# Patient Record
Sex: Male | Born: 1990 | Race: White | Hispanic: No | Marital: Married | Smoking: Never smoker
Health system: Southern US, Community
[De-identification: ages and names within clinical notes are randomized; demographics above are authoritative.]

---

## 2015-05-20 ENCOUNTER — Emergency Department (HOSPITAL_COMMUNITY)
Admission: EM | Admit: 2015-05-20 | Discharge: 2015-05-20 | Disposition: A | Discharge status: Home / Self Care | Attending: Emergency Medicine | Admitting: Emergency Medicine

## 2015-05-20 ENCOUNTER — Encounter (HOSPITAL_COMMUNITY): Payer: Self-pay | Admitting: Emergency Medicine

## 2015-05-20 ENCOUNTER — Emergency Department (HOSPITAL_COMMUNITY)

## 2015-05-20 DIAGNOSIS — R222 Localized swelling, mass and lump, trunk: Secondary | ICD-10-CM | POA: Diagnosis not present

## 2015-05-20 DIAGNOSIS — Y929 Unspecified place or not applicable: Secondary | ICD-10-CM | POA: Diagnosis not present

## 2015-05-20 DIAGNOSIS — Y9367 Activity, basketball: Secondary | ICD-10-CM | POA: Insufficient documentation

## 2015-05-20 DIAGNOSIS — S01511A Laceration without foreign body of lip, initial encounter: Secondary | ICD-10-CM | POA: Insufficient documentation

## 2015-05-20 DIAGNOSIS — R0781 Pleurodynia: Secondary | ICD-10-CM | POA: Diagnosis not present

## 2015-05-20 DIAGNOSIS — W19XXXA Unspecified fall, initial encounter: Secondary | ICD-10-CM | POA: Diagnosis not present

## 2015-05-20 DIAGNOSIS — Y999 Unspecified external cause status: Secondary | ICD-10-CM | POA: Insufficient documentation

## 2015-05-20 DIAGNOSIS — T07XXXA Unspecified multiple injuries, initial encounter: Secondary | ICD-10-CM

## 2015-05-20 DIAGNOSIS — S0101XA Laceration without foreign body of scalp, initial encounter: Secondary | ICD-10-CM | POA: Insufficient documentation

## 2015-05-20 DIAGNOSIS — S0990XA Unspecified injury of head, initial encounter: Secondary | ICD-10-CM | POA: Diagnosis present

## 2015-05-20 MED ORDER — LIDOCAINE-EPINEPHRINE-TETRACAINE (LET) SOLUTION
3.0000 mL | Freq: Once | NASAL | Status: AC
  Administered 2015-05-20: 3 mL via TOPICAL
  Filled 2015-05-20: qty 3

## 2015-05-20 MED ORDER — HYDROCODONE-ACETAMINOPHEN 5-325 MG PO TABS
1.0000 | ORAL_TABLET | Freq: Once | ORAL | Status: AC
  Administered 2015-05-20: 1 via ORAL
  Filled 2015-05-20: qty 1

## 2015-05-20 NOTE — ED Notes (Signed)
Patient is inmate [facility] stating he fell on the basketball court injuring his head. Patient has laceration noted to right side of head. Bleeding controlled at triage. Also has swelling and laceration on lower lip and abrasion to right side of face. Patient also complaining of left rib pain. States "I hurt my ribs a while back but they are worse tonight." Patient denies LOC. Denies assault.

## 2015-05-20 NOTE — ED Provider Notes (Signed)
CSN: [id number]     Arrival date & time 05/20/15  2127 History   First MD Initiated Contact with Patient 05/20/15 2145     Chief Complaint  Patient presents with  . Head Injury     (Consider location/radiation/quality/duration/timing/severity/associated sxs/prior Treatment) Patient is a 25 y.o. male presenting with head injury. The history is provided by the patient.  Head Injury Location:  L parietal Time since incident:  3 hours Mechanism of injury comment:  Basketball Pain details:    Quality:  Sharp   Radiates to:  Face   Severity:  Moderate   Timing:  Constant   Progression:  Worsening Chronicity:  New Relieved by:  Nothing Worsened by:  Nothing tried Ineffective treatments:  None tried Associated symptoms: headache   Associated symptoms: no blurred vision, no double vision and no loss of consciousness    Eugene Eugene [name] is a 25 y.o. male who [facility] the ED [facility] with a laceration to his head. Patient states he was playing basketball and fell. He also complains of left rib pain and swelling and laceratoin of the lower lip. Patient denies assault  History reviewed. No pertinent past medical history. History reviewed. No pertinent past surgical history. History reviewed. No pertinent family history. Social History  Substance Use Topics  . Smoking status: Never Smoker   . Smokeless tobacco: None  . Alcohol Use: No    Review of Systems  HENT: Positive for facial swelling.   Eyes: Negative for blurred vision and double vision.  Musculoskeletal: Positive for arthralgias.       Left rib pain  Skin: Positive for wound.  Neurological: Positive for headaches. Negative for loss of consciousness.  all other systems negative    Allergies  Review of patient's allergies indicates no known allergies.  Home Medications   Prior to Admission medications   Not on File   BP 135/69 mmHg  Pulse 84  Temp(Src) 98.2 F (36.8 C) (Oral)  Resp 14  Ht  5\' 7"  (1.702 Eugene [name])  Wt 81.647 kg  BMI 28.19 kg/m2  SpO2 99% Physical Exam  Constitutional: He is oriented to person, place, and time. He appears well-developed and well-nourished.  HENT:  Right Ear: Tympanic membrane normal.  Left Ear: Tympanic membrane normal.  Nose: Nose normal.  Mouth/Throat: Uvula is midline.    There is swelling to the left side of the lower lip and there is a laceration to the inside of the lower lip. Bleeding has stopped.   There is a 1 cm laceration to the left side of the scalp.   Eyes: EOM are normal. Pupils are equal, round, and reactive to light.  Neck: Normal range of motion. Neck supple.  Cardiovascular: Normal rate and regular rhythm.   Pulmonary/Chest: Effort normal and breath sounds normal.  Abdominal: Soft. Bowel sounds are normal. There is no tenderness.  Musculoskeletal: Normal range of motion.  Neurological: He is alert and oriented to person, place, and time. No cranial nerve deficit.  Skin: Skin is warm and dry.  Psychiatric: He has a normal mood and affect. His behavior is normal.  Nursing note and vitals reviewed.   ED Course  .Marland Kitchen.Laceration Repair Date/Time: 05/20/2015 11:32 PM Performed by: Janne NapoleonNEESE, Eugene Eugene [name] Authorized by: Janne NapoleonNEESE, Eugene Eugene [name] Consent: Verbal consent obtained. Risks and benefits: risks, benefits and alternatives were discussed Consent given by: patient Patient understanding: patient states understanding of the procedure being performed Required items: required blood products, implants, devices, and special equipment  available Patient identity confirmed: verbally with patient Body area: head/neck Location details: scalp Laceration length: 1 cm Tendon involvement: none Vascular damage: no Anesthesia method: LET. Patient sedated: no Preparation: Patient was prepped and draped in the usual sterile fashion. Irrigation solution: saline Amount of cleaning: standard Debridement: none Degree of undermining: none Wound skin closure  material used: staple x1. Patient tolerance: Patient tolerated the procedure well with no immediate complications Comments: Up to date on tetanus    Dg Ribs Unilateral W/chest Left  05/20/2015  CLINICAL DATA:  Acute onset of left-sided anterior, posterior and axillary rib pain. Initial encounter. EXAM: LEFT RIBS AND CHEST - 3+ VIEW COMPARISON:  None. FINDINGS: No displaced rib fractures are seen. The lungs are well-aerated and clear. There is no evidence of focal opacification, pleural effusion or pneumothorax. The cardiomediastinal silhouette is within normal limits. No acute osseous abnormalities are seen. IMPRESSION: No displaced rib fracture seen. No acute cardiopulmonary process identified. Electronically Signed   By: Roanna Raider Eugene [name].D.   On: 05/20/2015 22:35     MDM  25 y.o. male with scalp laceration, lower lip laceration and contusions stable for d/c without focal neuro deficits. Will give Hydrocodone now and he will take tylenol or ibuprofen as needed. He will f/u in 5 days for staple removal. He will return here as needed for problems.   Final diagnoses:  Laceration of scalp, initial encounter  Multiple contusions       Janne Napoleon, NP 05/20/15 2336  Mancel Bale, MD 05/21/15 [phone]

## 2015-05-20 NOTE — Discharge Instructions (Signed)
Take tylenol or ibuprofen as needed for pain. Follow up in 5 days for staple removal.

## 2017-04-27 IMAGING — DX DG RIBS W/ CHEST 3+V*L*
4 series · 4 of 4 positions shown · non-contrast
Comparison: None.

CLINICAL DATA: Acute onset of left-sided anterior, posterior and
axillary rib pain. Initial encounter.

EXAM:
LEFT RIBS AND CHEST - 3+ VIEW

[chest pa]
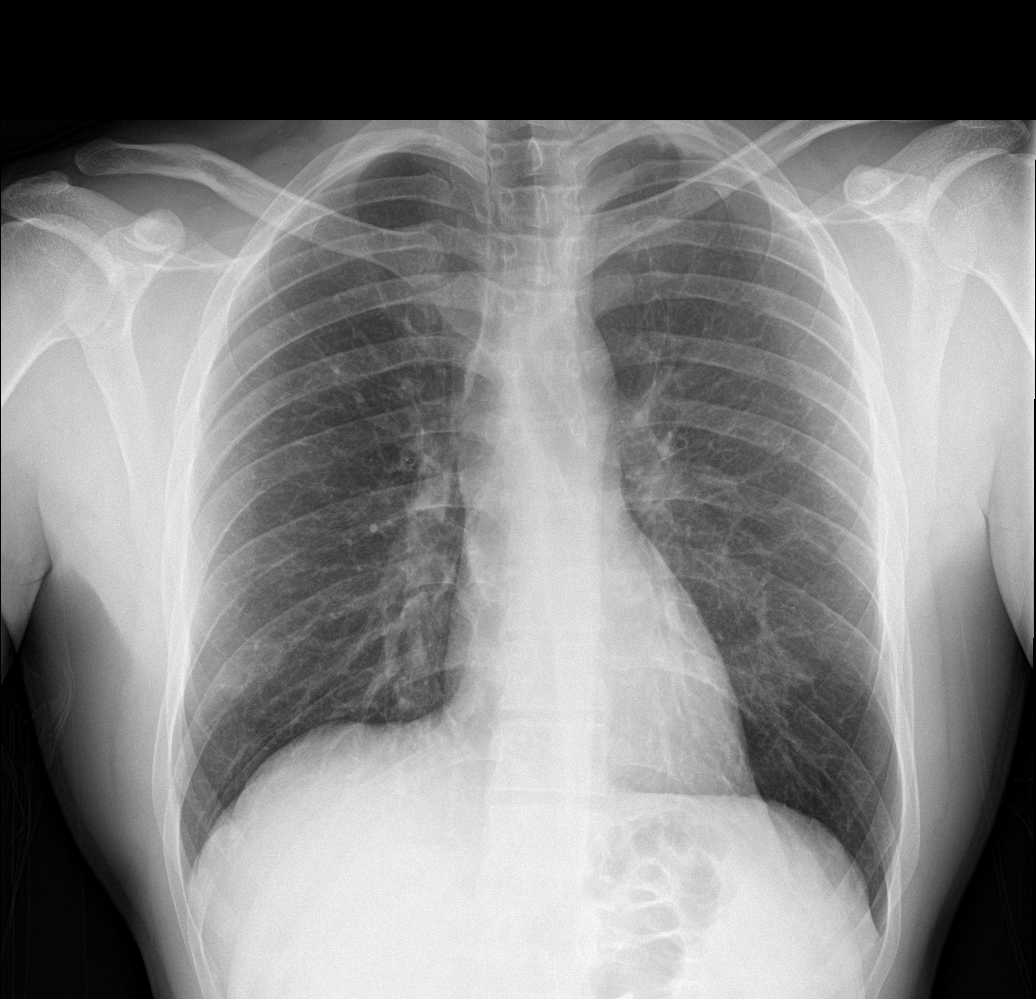

[rib pa obl (1 of 2)]
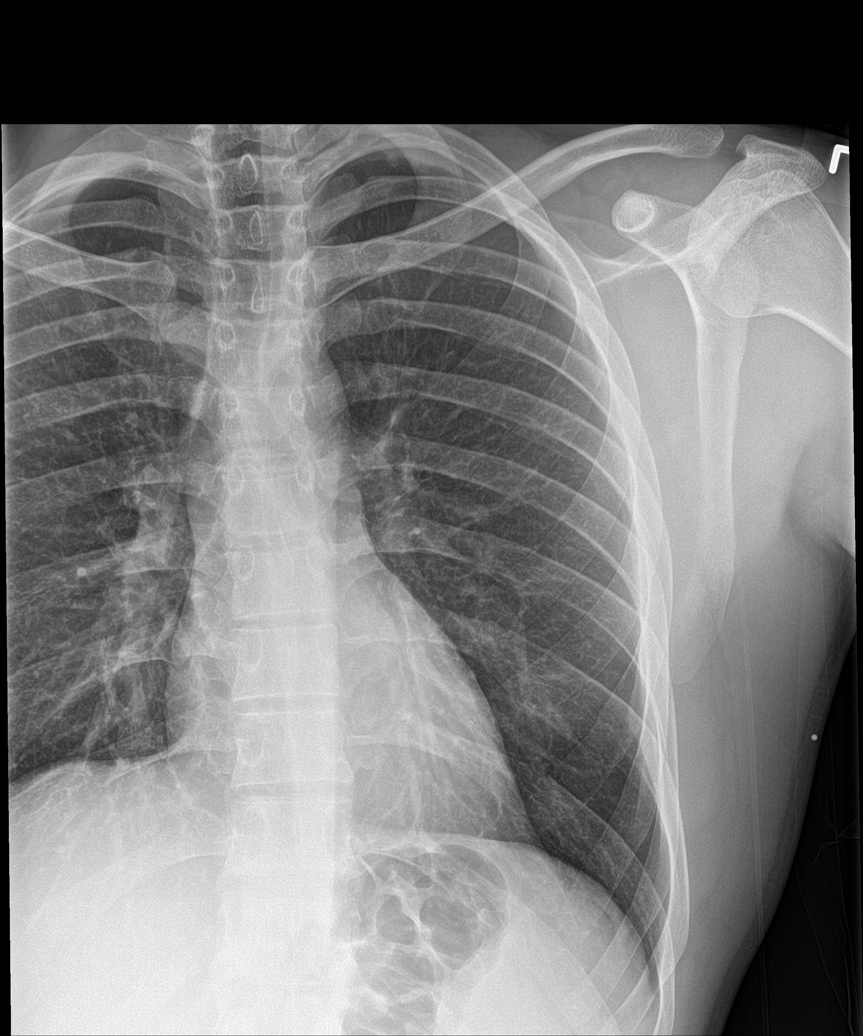

[rib pa obl (2 of 2)]
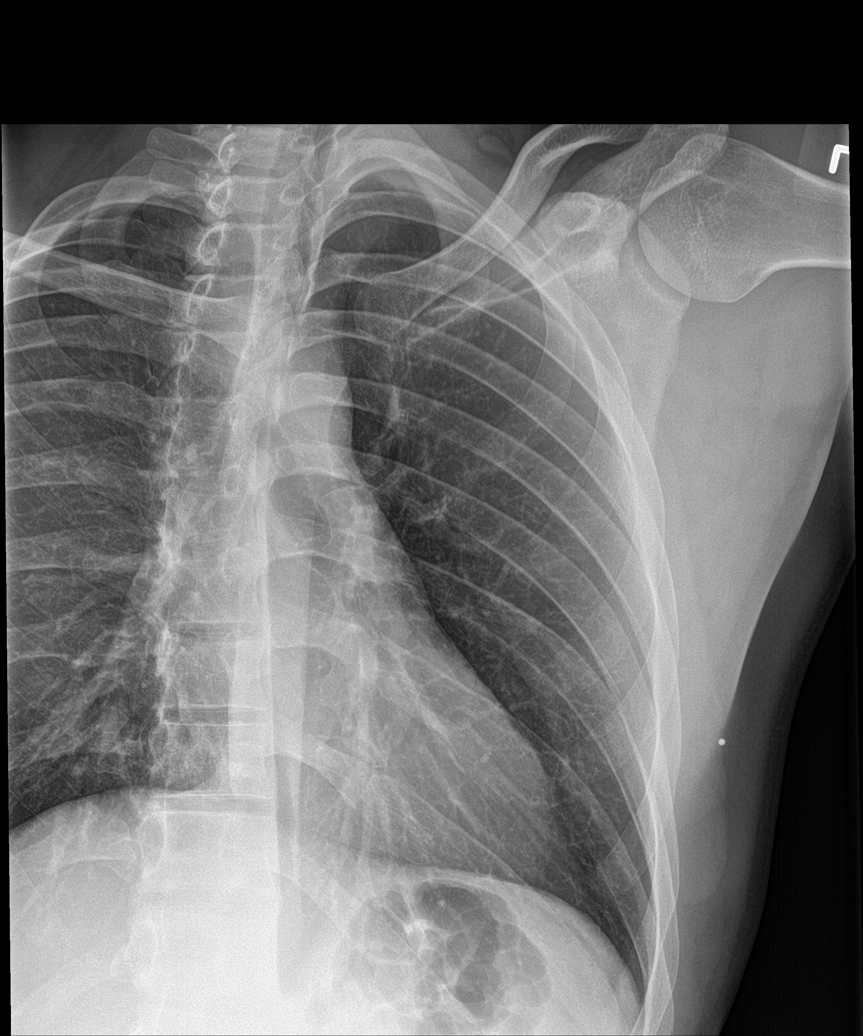

[rib pa]
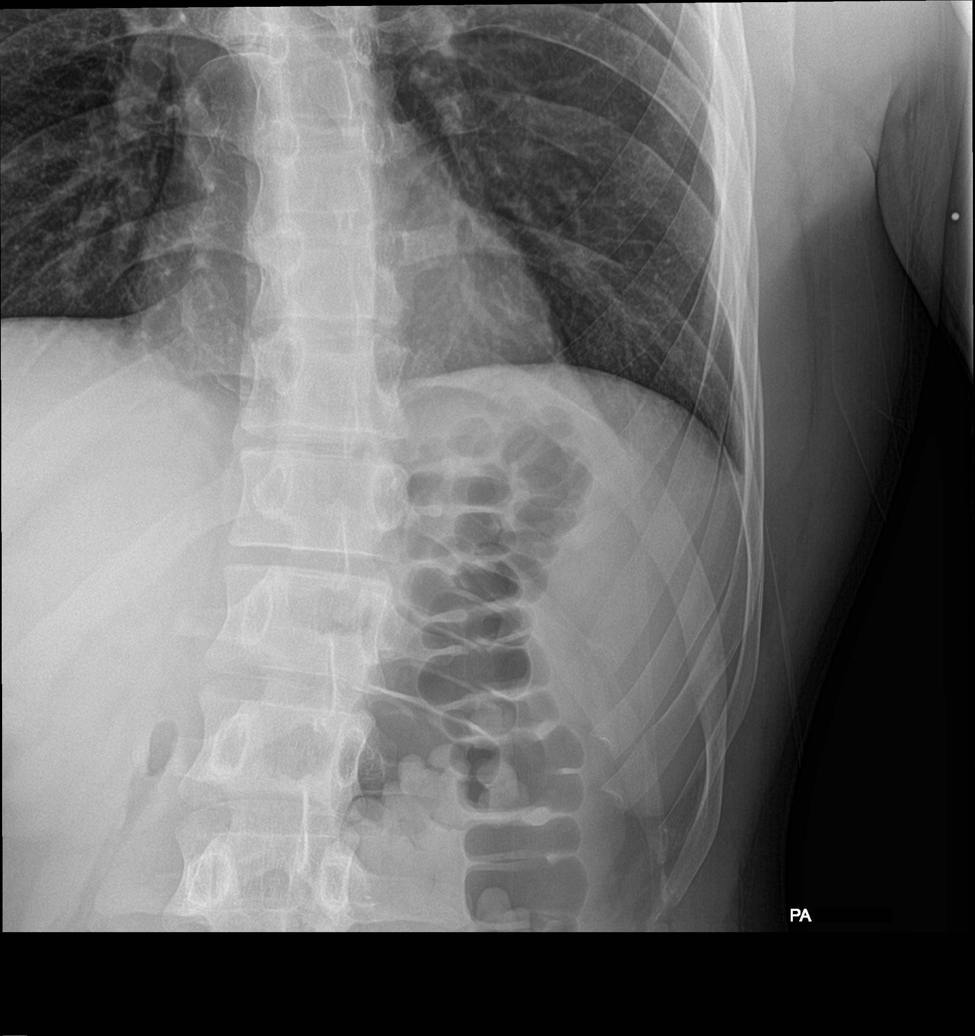

[4 of 4 positions shown; findings below may reference images not displayed]

FINDINGS: No displaced rib fractures are seen.

The lungs are well-aerated and clear. There is no evidence of focal
opacification, pleural effusion or pneumothorax.

The cardiomediastinal silhouette is within normal limits. No acute
osseous abnormalities are seen.
IMPRESSION: No displaced rib fracture seen. No acute cardiopulmonary process
identified.

## 2020-01-23 DEATH — deceased
